# Patient Record
Sex: Male | Born: 1990 | Race: Black or African American | Hispanic: No | Marital: Single | Smoking: Never smoker
Health system: Southern US, Community
[De-identification: ages and names within clinical notes are randomized; demographics above are authoritative.]

## PROBLEM LIST (undated history)

## (undated) ENCOUNTER — Ambulatory Visit: Admission: EM | Discharge status: Against Medical Advice | Payer: Self-pay

## (undated) HISTORY — PX: NO PAST SURGERIES: SHX2092

---

## 2018-05-28 ENCOUNTER — Ambulatory Visit
Admission: EM | Admit: 2018-05-28 | Discharge: 2018-05-28 | Disposition: A | Discharge status: Home / Self Care | Payer: Managed Care, Other (non HMO) | Attending: Family Medicine | Admitting: Family Medicine

## 2018-05-28 ENCOUNTER — Other Ambulatory Visit: Payer: Self-pay

## 2018-05-28 DIAGNOSIS — Z113 Encounter for screening for infections with a predominantly sexual mode of transmission: Secondary | ICD-10-CM | POA: Diagnosis not present

## 2018-05-28 DIAGNOSIS — Z202 Contact with and (suspected) exposure to infections with a predominantly sexual mode of transmission: Secondary | ICD-10-CM

## 2018-05-28 LAB — CHLAMYDIA/NGC RT PCR (ARMC ONLY)
Chlamydia Tr: NOT DETECTED
N gonorrhoeae: NOT DETECTED

## 2018-05-28 NOTE — ED Provider Notes (Signed)
[facility]   CSN: [id number] Arrival date & time: 05/28/18  1448  History   Chief Complaint Chief Complaint  Patient presents with  . SEXUALLY TRANSMITTED DISEASE   HPI  27 year old male presents for STD testing.  Patient has no symptoms.  Denies penile discharge, dysuria.  Patient states that he just wants to be tested.  When I asked him why, he states that he and his partner are not in a relationship.  He states that she is not here for him.  He wants to be tested to ensure he has no STDs.  Denies penile lesions.  No other reported symptoms or concerns at this time.  PMH, Surgical Hx, Family Hx, Social History reviewed and updated as below.  PMH: No significant PMH.  Past Surgical History:  Procedure Laterality Date  . NO PAST SURGERIES     Home Medications    Prior to Admission medications   Not on File   Social History Social History   Tobacco Use  . Smoking status: Never Smoker  . Smokeless tobacco: Never Used  Substance Use Topics  . Alcohol use: Not Currently  . Drug use: Not Currently    Allergies   Patient has no known allergies.   Review of Systems Review of Systems  Constitutional: Negative.   Genitourinary: Negative.    Physical Exam Triage Vital Signs ED Triage Vitals  Enc Vitals Group     BP 05/28/18 1507 117/76     Pulse Rate 05/28/18 1507 73     Resp 05/28/18 1507 18     Temp 05/28/18 1507 98.5 F (36.9 C)     Temp Source 05/28/18 1507 Oral     SpO2 05/28/18 1507 99 %     Weight 05/28/18 1505 220 lb (99.8 kg)     Height 05/28/18 1505 5\' 11"  (1.803 m)     Head Circumference --      Peak Flow --      Pain Score 05/28/18 1505 0     Pain Loc --      Pain Edu? --      Excl. in GC? --    Updated Vital Signs BP 117/76 (BP Location: Right Arm)   Pulse 73   Temp 98.5 F (36.9 C) (Oral)   Resp 18   Ht 5\' 11"  (1.803 m)   Wt 99.8 kg   SpO2 99%   BMI 30.68 kg/m   Visual Acuity Right Eye Distance:   Left Eye  Distance:   Bilateral Distance:    Right Eye Near:   Left Eye Near:    Bilateral Near:     Physical Exam  Constitutional: He is oriented to person, place, and time. He appears well-developed. No distress.  HENT:  Head: Normocephalic and atraumatic.  Eyes: Conjunctivae are normal. Right eye exhibits no discharge. Left eye exhibits no discharge.  Pulmonary/Chest: Effort normal. No respiratory distress.  Neurological: He is alert and oriented to person, place, and time.  Psychiatric: He has a normal mood and affect. His behavior is normal.  Nursing note and vitals reviewed.  UC Treatments / Results  Labs (all labs ordered are listed, but only abnormal results are displayed) Labs Reviewed  CHLAMYDIA/NGC RT PCR ([facility] ONLY)  RPR  HIV ANTIBODY (ROUTINE TESTING W REFLEX)  HSV(HERPES SIMPLEX VRS) I + II AB-IGM  HSV(HERPES SIMPLEX VRS) I + II AB-IGG   EKG None  Radiology No results found.  Procedures Procedures (including critical care time)  Medications  Ordered in UC Medications - No data to display  Initial Impression / Assessment and Plan / UC Course  I have reviewed the triage vital signs and the nursing notes.  Pertinent labs & imaging results that were available during my care of the patient were reviewed by me and considered in my medical decision making (see chart for details).    27 year old male presents with possible exposure to STD.  STD testing completed today.  Awaiting results.  Final Clinical Impressions(s) / UC Diagnoses   Final diagnoses:  Possible exposure to STD     Discharge Instructions     We will call with the results.  Take care  Dr. Adriana Simasook    ED Prescriptions    None     Controlled Substance Prescriptions [place] Controlled Substance Registry consulted? Not Applicable   Tommie SamsCook, [place] Catlin G, DO 05/28/18 1705

## 2018-05-28 NOTE — ED Triage Notes (Signed)
Patient states that he is here to be tested for STDS. Reports that he would like to be tested for Herpes as well, denies any STD symptoms or known exposure.

## 2018-05-28 NOTE — Discharge Instructions (Signed)
We will call with the results.  Take care  Dr. Tahisha Hakim  

## 2018-05-29 LAB — HSV(HERPES SIMPLEX VRS) I + II AB-IGG: HSV 1 GLYCOPROTEIN G AB, IGG: 7 {index} — AB (ref 0.00–0.90)

## 2018-05-29 LAB — HSV(HERPES SIMPLEX VRS) I + II AB-IGM: HSVI/II Comb IgM: <0.91 Ratio (ref 0.00–0.90)

## 2018-05-29 LAB — HIV ANTIBODY (ROUTINE TESTING W REFLEX): HIV SCREEN 4TH GENERATION: NONREACTIVE

## 2018-05-29 LAB — RPR: RPR: NONREACTIVE

## 2018-05-30 ENCOUNTER — Telehealth (HOSPITAL_COMMUNITY): Payer: Self-pay

## 2018-05-30 NOTE — Telephone Encounter (Signed)
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 Attempted to reach patient. No answer at this time.

## 2018-06-01 ENCOUNTER — Ambulatory Visit
Admission: EM | Admit: 2018-06-01 | Discharge: 2018-06-01 | Disposition: A | Discharge status: Home / Self Care | Payer: Managed Care, Other (non HMO) | Attending: Family Medicine | Admitting: Family Medicine

## 2018-06-01 ENCOUNTER — Other Ambulatory Visit: Payer: Self-pay

## 2018-06-01 DIAGNOSIS — Z87898 Personal history of other specified conditions: Secondary | ICD-10-CM | POA: Diagnosis not present

## 2018-06-01 DIAGNOSIS — L02412 Cutaneous abscess of left axilla: Secondary | ICD-10-CM

## 2018-06-01 MED ORDER — SULFAMETHOXAZOLE-TRIMETHOPRIM 800-160 MG PO TABS: 1.0000 | ORAL_TABLET | Freq: Two times a day (BID) | ORAL | 0 refills | Status: DC

## 2018-06-01 NOTE — Discharge Instructions (Signed)
Warm compresses to area every 2-3 hours for 15-20 minutes at a time

## 2018-06-01 NOTE — ED Triage Notes (Signed)
Patient complains of possible abscess to his left axilla that he noticed 2 days ago.   Patient states that he was seen by an urgent care over the weekend due to a reaction from the powder that he works around and was wondering if we could help move to a different location at work.

## 2018-06-01 NOTE — ED Provider Notes (Signed)
[facility]    CSN: [id number] Arrival date & time: 06/01/18  1112     History   Chief Complaint Chief Complaint  Patient presents with  . Abscess    HPI Jarquez Medlen is a 27 y.o. male.   27 yo male with a c/o possible left axilla abscess for 2 days. States he noticed a bump 2 days ago that has gotten bigger and tender. Denies any drainage, fevers, chills.   Patient also states that 2 days ago at work he had a reaction while at work where he felt short of breath and wheezy. States paramedics were called and then he went to another urgent care for evaluation with resolution of his symptoms.  States that in his work he's exposed to different types of flours and he breaths the dust particles from this. Patient requesting to be moved to a different location at work.   The history is provided by the patient.    History reviewed. No pertinent past medical history.  There are no active problems to display for this patient.   Past Surgical History:  Procedure Laterality Date  . NO PAST SURGERIES         Home Medications    Prior to Admission medications   Medication Sig Start Date End Date Taking? Authorizing Provider  sulfamethoxazole-trimethoprim (BACTRIM DS,SEPTRA DS) 800-160 MG tablet Take 1 tablet by mouth 2 (two) times daily. 06/01/18   Payton Mccallum, MD    Family History History reviewed. No pertinent family history.  Social History Social History   Tobacco Use  . Smoking status: Never Smoker  . Smokeless tobacco: Never Used  Substance Use Topics  . Alcohol use: Not Currently  . Drug use: Not Currently     Allergies   Patient has no known allergies.   Review of Systems Review of Systems   Physical Exam Triage Vital Signs ED Triage Vitals  Enc Vitals Group     BP 06/01/18 1157 129/85     Pulse Rate 06/01/18 1157 (!) 54     Resp 06/01/18 1157 18     Temp 06/01/18 1157 98.3 F (36.8 C)     Temp Source 06/01/18 1157 Oral   SpO2 06/01/18 1157 100 %     Weight 06/01/18 1156 220 lb (99.8 kg)     Height 06/01/18 1156 5\' 11"  (1.803 m)     Head Circumference --      Peak Flow --      Pain Score 06/01/18 1156 9     Pain Loc --      Pain Edu? --      Excl. in GC? --    No data found.  Updated Vital Signs BP 129/85 (BP Location: Right Arm)   Pulse (!) 54   Temp 98.3 F (36.8 C) (Oral)   Resp 18   Ht 5\' 11"  (1.803 m)   Wt 99.8 kg   SpO2 100%   BMI 30.68 kg/m   Visual Acuity Right Eye Distance:   Left Eye Distance:   Bilateral Distance:    Right Eye Near:   Left Eye Near:    Bilateral Near:     Physical Exam  Constitutional: He appears well-developed and well-nourished. No distress.  Skin: He is not diaphoretic.  2cm subcutaneous tender nodule, non-fluctuant with overlying skin erythema  Nursing note and vitals reviewed.    UC Treatments / Results  Labs (all labs ordered are listed, but only abnormal results are displayed) Labs  Reviewed - No data to display  EKG None  Radiology No results found.  Procedures Procedures (including critical care time)  Medications Ordered in UC Medications - No data to display  Initial Impression / Assessment and Plan / UC Course  I have reviewed the triage vital signs and the nursing notes.  Pertinent labs & imaging results that were available during my care of the patient were reviewed by me and considered in my medical decision making (see chart for details).      Final Clinical Impressions(s) / UC Diagnoses   Final diagnoses:  Abscess of left axilla  H/O wheezing     Discharge Instructions     Warm compresses to area every 2-3 hours for 15-20 minutes at a time     ED Prescriptions    Medication Sig Dispense Auth. Provider   sulfamethoxazole-trimethoprim (BACTRIM DS,SEPTRA DS) 800-160 MG tablet Take 1 tablet by mouth 2 (two) times daily. 20 tablet Payton Mccallumonty, Ahlivia Salahuddin, MD     1. diagnosis reviewed with patient 2. rx as per orders  above; reviewed possible side effects, interactions, risks and benefits  3. Discussed with patient that he should follow up with PCP or through work to be seen by an occupational health specialist or other specialist for further evaluation of possible allergic reaction at work.   4. Follow-up prn if symptoms worsen or don't improve    Controlled Substance Prescriptions [place] Controlled Substance Registry consulted? Not Applicable   Payton Mccallumonty, Trevonn Hallum, MD 06/01/18 [phone]

## 2018-06-04 ENCOUNTER — Ambulatory Visit (INDEPENDENT_AMBULATORY_CARE_PROVIDER_SITE_OTHER): Payer: Managed Care, Other (non HMO) | Admitting: Allergy

## 2018-06-04 ENCOUNTER — Encounter: Payer: Self-pay | Admitting: Allergy

## 2018-06-04 VITALS — BP 108/84 | HR 68 | Temp 98.0°F | Resp 16 | Ht 71.0 in | Wt 230.0 lb

## 2018-06-04 DIAGNOSIS — R062 Wheezing: Secondary | ICD-10-CM

## 2018-06-04 DIAGNOSIS — T7840XD Allergy, unspecified, subsequent encounter: Secondary | ICD-10-CM | POA: Diagnosis not present

## 2018-06-04 DIAGNOSIS — J3089 Other allergic rhinitis: Secondary | ICD-10-CM | POA: Diagnosis not present

## 2018-06-04 MED ORDER — ALBUTEROL SULFATE HFA 108 (90 BASE) MCG/ACT IN AERS: INHALATION_SPRAY | RESPIRATORY_TRACT | 1 refills | Status: AC

## 2018-06-04 NOTE — Progress Notes (Signed)
New Patient Note  RE: George Parks MRN: [id number] DOB: Oct 11, 1990 Date of Office Visit: 06/04/2018  Referring provider: No ref. provider found Primary care provider: Patient, No Pcp Per  Chief Complaint: Food Intolerance  History of Present Illness: I had the pleasure of seeing George Parks for initial evaluation at the [facility] on 06/05/2018. He is a 27 y.o. male, who is self-referred here for the evaluation of allergies.    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 He reports symptoms of chest tightness, wheezing, itchy lips, sneezing, rhinorrhea, and watery eyes. The symptoms usually start within 1 hour of working and resolve a few hours after he is off shift. Symptoms have been going on for 2-3 weeks and progressively worsening. He has bee moved to a different area where there is no flour in the air and has not had any issues. Patient is able to consume gluten items with no issues.   Patient does not take any medications for this. No previous history of asthma. He does have some pollen allergies which bother his eyes during the spring time.  Assessment and Plan: George Parks is a 27 y.o. 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 Other symptoms include itchy lips, sneezing, rhinorrhea, and watery eyes. Usually starts within 1 hour of starting shift and resolves a few hours after ending his shift. No history of asthma in the past.  Today's spirometry was normal and no improvement post-bronchodilator treatment.  Today's skin testing showed: positive to grass, weed, ragweed, trees, cat and slightly to mold. Food testing was borderline positive to barley, hops and rye however patient has no issues with consuming gluten containing foods.   He most likely has some type of contact sensitivity when inhaling or  coming contact with the dry uncooked flour particles. Advised patient to avoid working in that part of the building. He has been having no issues with working near the [employer] areas.   For mild symptoms you can take over the counter antihistamines such as Benadryl and monitor symptoms closely. If symptoms worsen or if you have severe symptoms including breathing issues, throat closure, significant swelling, whole body hives, severe diarrhea and vomiting, lightheadedness then seek immediate medical care.  Provided patient with letter to present to his employer regarding today's evaluation.   Wheezing See plan as above.  May use albuterol rescue inhaler 2 puffs or nebulizer every 4 to 6 hours as needed for shortness of breath, chest tightness, coughing, and wheezing. May use albuterol rescue inhaler 2 puffs 5 to 15 minutes prior to strenuous physical activities.  Other allergic rhinitis  Today's skin testing showed: positive to grass, weed, ragweed, trees, cat and slightly to mold.  Discussed environmental control measures.  May use over the counter antihistamines such as Zyrtec (cetirizine), Claritin (loratadine), Allegra (fexofenadine), or Xyzal (levocetirizine) daily as needed.  Return in about 4 months (around 10/03/2018).  Other allergy screening: Asthma: no Rhino conjunctivitis: yes Patient gets itchy eyes during the spring only. Food allergy: no  Dietary History: patient has been eating other foods including milk, eggs, peanut, treenuts, sesame, shellfish, seafood, soy, wheat, meats, fruits and vegetables.  Medication allergy: no Hymenoptera allergy: no Urticaria: no Eczema:no History of recurrent infections suggestive of immunodeficency: no  Diagnostics: Spirometry:  Tracings reviewed. His effort: Good reproducible efforts. FVC:  5.12L FEV1: 3.86L, 92% predicted FEV1/FVC ratio: 75% Interpretation: Spirometry consistent with normal pattern and no improvement post  bronchodilator treatment. Please see scanned spirometry results for details.  Skin Testing: Environmental allergy panel and select foods Positive test to: grass, weed, ragweed, trees, cat and slightly to mold. Food testing was borderline positive to barley, hops and rye. He also had slight positive reaction to the negative control test.   Results discussed with patient/family. Airborne Adult Perc - 06/04/18 1516    Time Antigen Placed  1516    Allergen Manufacturer  Waynette Buttery    Location  Back    Number of Test  59    Panel 1  Select    1. Control-Buffer 50% Glycerol  Negative    2. Control-Histamine 1 mg/ml  3+    3. Albumin saline  2+    4. Bahia  Negative    5. [place]  4+    6. Johnson  4+    7. Kentucky Blue  Negative    8. Meadow Fescue  4+    9. Perennial Rye  4+    10. Sweet Vernal  4+    11. Timothy  4+    12. Cocklebur  4+    13. Burweed Marshelder  3+    14. Ragweed, short  4+    15. Ragweed, Giant  2+    16. Plantain,  English  4+    17. Lamb's Quarters  4+    18. Sheep Sorrell  4+    19. Rough Pigweed  Negative    20. Marsh Elder, Rough  4+    21. Mugwort, Common  4+    22. Ash mix  4+    23. Birch mix  4+    24. Beech American  4+    25. Box, Elder  4+    26. Cedar, red  4+    27. Cottonwood, Eastern  4+    28. Elm mix  4+    29. Hickory mix  4+    30. Maple mix  Negative    31. Oak, Guinea-Bissau mix  4+    32. Pecan Pollen  4+    33. Pine mix  4+    34. Sycamore Eastern  4+    35. Walnut, Black Pollen  4+    36. Alternaria alternata  Negative    37. Cladosporium Herbarum  Negative    38. Aspergillus mix  Negative    39. Penicillium mix  Negative    40. Bipolaris sorokiniana (Helminthosporium)  Negative    41. Drechslera spicifera (Curvularia)  Negative    42. Mucor plumbeus  Negative    43. Fusarium moniliforme  Negative    44. Aureobasidium pullulans (pullulara)  Negative    45. Rhizopus oryzae  Negative    46. Botrytis cinera  Negative    47.  Epicoccum nigrum  Negative    48. Phoma betae  Negative    49. Candida Albicans  Negative    50. Trichophyton mentagrophytes  Negative    51. Mite, D Farinae  5,000 AU/ml  Negative    52. Mite, D Pteronyssinus  5,000 AU/ml  Negative    53. Cat Hair 10,000 BAU/ml  Negative    54.  Dog Epithelia  Negative    55. Mixed Feathers  Negative    56. Horse Epithelia  Negative    57. Cockroach, German  Negative    58. Mouse  Negative  59. Tobacco Leaf  Negative     Intradermal - 06/04/18 1538    Time Antigen Placed  1538    Allergen Manufacturer  Waynette Buttery    Location  Arm    Number of Test  8    Intradermal  Select    Control  Negative    Mold 1  2+    Mold 2  Negative    Mold 3  2+    Mold 4  2+    Cat  2+    Dog  Negative    Cockroach  Negative    Mite mix  Negative     Food Adult Perc - 06/04/18 1500    Time Antigen Placed  1516    Location  Back    Number of allergen test  6    Control-Histamine 1 mg/ml  3+    3. Wheat  Negative    30. Barley  2+   4x4   31. Oat   Negative    32. Rye   2+   2x2   33. Hops  2+   2x2   36. Saccharomyces Cerevisiae   Negative       Past Medical History: Patient Active Problem List   Diagnosis Date Noted  . Allergic reaction 06/05/2018  . Wheezing 06/04/2018  . Other allergic rhinitis 06/04/2018   History reviewed. No pertinent past medical history. Past Surgical History: Past Surgical History:  Procedure Laterality Date  . NO PAST SURGERIES     Medication List:  Current Outpatient Medications  Medication Sig Dispense Refill  . sulfamethoxazole-trimethoprim (BACTRIM DS,SEPTRA DS) 800-160 MG tablet Take 1 tablet by mouth 2 (two) times daily. 20 tablet 0  . albuterol (PROVENTIL HFA;VENTOLIN HFA) 108 (90 Base) MCG/ACT inhaler Use 2 puffs every 4 to 6 hours as needed for shortness of breath, chest tightness, coughing and wheezing 1 Inhaler 1   No current facility-administered medications for this visit.    Allergies: Allergies    Allergen Reactions  . Pollen Extract Other (See Comments)    Unknown reaction   Social History: Social History   Socioeconomic History  . Marital status: Single    Spouse name: Not on file  . Number of children: Not on file  . Years of education: Not on file  . Highest education level: Not on file  Occupational History  . Not on file  Social Needs  . Financial resource strain: Not on file  . Food insecurity:    Worry: Not on file    Inability: Not on file  . Transportation needs:    Medical: Not on file    Non-medical: Not on file  Tobacco Use  . Smoking status: Never Smoker  . Smokeless tobacco: Never Used  Substance and Sexual Activity  . Alcohol use: Not Currently  . Drug use: Not Currently  . Sexual activity: Not on file  Lifestyle  . Physical activity:    Days per week: Not on file    Minutes per session: Not on file  . Stress: Not on file  Relationships  . Social connections:    Talks on phone: Not on file    Gets together: Not on file    Attends religious service: Not on file    Active member of club or organization: Not on file    Attends meetings of clubs or organizations: Not on file    Relationship status: Not on file  Other Topics Concern  . Not on  file  Social History Narrative  . Not on file   Lives in a apartment. Smoking: denies Occupation: flour mixer for 3.5 weeks  Environmental History: Water Damage/mildew in the house: no Carpet in the family room: no Carpet in the bedroom: no Heating: gas Cooling: central Pet: no  Family History: History reviewed. No pertinent family history. Problem                               Relation Asthma                                   No  Eczema                                No  Food allergy                          No  Allergic rhino conjunctivitis     Father   Review of Systems  Constitutional: Negative for appetite change, chills, fever and unexpected weight change.  HENT: Negative for  congestion and rhinorrhea.   Eyes: Negative for itching.  Respiratory: Negative for cough, chest tightness, shortness of breath and wheezing.   Cardiovascular: Negative for chest pain.  Gastrointestinal: Negative for abdominal pain.  Genitourinary: Negative for difficulty urinating.  Skin: Positive for rash.  Allergic/Immunologic: Negative for environmental allergies and food allergies.  Neurological: Negative for headaches.   Objective: BP 108/84 (BP Location: Right Arm, Patient Position: Sitting, Cuff Size: Large)   Pulse 68   Temp 98 F (36.7 C) (Oral)   Resp 16   Ht 5\' 11"  (1.803 m)   Wt 230 lb (104.3 kg)   SpO2 97%   BMI 32.08 kg/m  Body mass index is 32.08 kg/m. Physical Exam  Constitutional: He is oriented to person, place, and time. He appears well-developed and well-nourished.  HENT:  Head: Normocephalic and atraumatic.  Right Ear: External ear normal.  Left Ear: External ear normal.  Nose: Nose normal.  Mouth/Throat: Oropharynx is clear and moist.  Eyes: Conjunctivae and EOM are normal.  Neck: Neck supple.  Cardiovascular: Normal rate, regular rhythm and normal heart sounds. Exam reveals no gallop and no friction rub.  No murmur heard. Pulmonary/Chest: Effort normal and breath sounds normal. He has no wheezes. He has no rales.  Abdominal: Soft. Bowel sounds are normal. There is no tenderness.  Lymphadenopathy:    He has no cervical adenopathy.  Neurological: He is alert and oriented to person, place, and time.  Skin: Skin is warm. No rash noted.  Psychiatric: He has a normal mood and affect. His behavior is normal.  Nursing note and vitals reviewed.  The plan was reviewed with the patient/family, and all questions/concerned were addressed.  It was my pleasure to see George Parks today and participate in his care. Please feel free to contact me with any questions or concerns.  Sincerely,  Wyline MoodYoon Kim, DO Allergy & Immunology  [facility]  [place] office: [phone] [facility] office:[phone]

## 2018-06-04 NOTE — Patient Instructions (Signed)
Today's skin testing was positive to grass, weed, ragweed, trees, cat and slightly to mold.  Food testing was borderline positive to barley, hops and rye.  May use albuterol rescue inhaler 2 puffs or nebulizer every 4 to 6 hours as needed for shortness of breath, chest tightness, coughing, and wheezing. May use albuterol rescue inhaler 2 puffs 5 to 15 minutes prior to strenuous physical activities.  For mild symptoms you can take over the counter antihistamines such as Benadryl and monitor symptoms closely. If symptoms worsen or if you have severe symptoms including breathing issues, throat closure, significant swelling, whole body hives, severe diarrhea and vomiting, lightheadedness then seek immediate medical care.  May use over the counter antihistamines such as Zyrtec (cetirizine), Claritin (loratadine), Allegra (fexofenadine), or Xyzal (levocetirizine) daily as needed.  Follow up in 4 months. Will let you know when letter is ready for you to pick up.   Reducing Pollen Exposure . Pollen seasons: trees (spring), grass (summer) and ragweed/weeds (fall). Marland Kitchen. Keep windows closed in your home and car to lower pollen exposure.  Lilian Kapur. Install air conditioning in the bedroom and throughout the house if possible.  . Avoid going out in dry windy days - especially early morning. . Pollen counts are highest between 5 - 10 AM and on dry, hot and windy days.  . Save outside activities for late afternoon or after a heavy rain, when pollen levels are lower.  . Avoid mowing of grass if you have grass pollen allergy. Marland Kitchen. Be aware that pollen can also be transported indoors on people and pets.  . Dry your clothes in an automatic dryer rather than hanging them outside where they might collect pollen.  . Rinse hair and eyes before bedtime. Pet Allergen Avoidance: . Contrary to popular opinion, there are no "hypoallergenic" breeds of dogs or cats. That is because people are not allergic to an animal's hair, but to an  allergen found in the animal's saliva, dander (dead skin flakes) or urine. Pet allergy symptoms typically occur within minutes. For some people, symptoms can build up and become most severe 8 to 12 hours after contact with the animal. People with severe allergies can experience reactions in public places if dander has been transported on the pet owners' clothing. Marland Kitchen. Keeping an animal outdoors is only a partial solution, since homes with pets in the yard still have higher concentrations of animal allergens. . Before getting a pet, ask your allergist to determine if you are allergic to animals. If your pet is already considered part of your family, try to minimize contact and keep the pet out of the bedroom and other rooms where you spend a great deal of time. . As with dust mites, vacuum carpets often or replace carpet with a hardwood floor, tile or linoleum. . High-efficiency particulate air (HEPA) cleaners can reduce allergen levels over time. . While dander and saliva are the source of cat and dog allergens, urine is the source of allergens from rabbits, hamsters, mice and [place]; so ask a non-allergic family member to clean the animal's cage. . If you have a pet allergy, talk to your allergist about the potential for allergy immunotherapy (allergy shots). This strategy can often provide long-term relief. Mold Control . Mold and fungi can grow on a variety of surfaces provided certain temperature and moisture conditions exist.  . Outdoor molds grow on plants, decaying vegetation and soil. The major outdoor mold, Alternaria and Cladosporium, are found in very high numbers during  hot and dry conditions. Generally, a late summer - fall peak is seen for common outdoor fungal spores. Rain will temporarily lower outdoor mold spore count, but counts rise rapidly when the rainy period ends. . The most important indoor molds are Aspergillus and Penicillium. Dark, humid and poorly ventilated basements are  ideal sites for mold growth. The next most common sites of mold growth are the bathroom and the kitchen. Outdoor (Seasonal) Mold Control . Use air conditioning and keep windows closed. . Avoid exposure to decaying vegetation. Marland Kitchen Avoid leaf raking. . Avoid grain handling. . Consider wearing a face mask if working in moldy areas.  Indoor (Perennial) Mold Control  . Maintain humidity below 50%. . Get rid of mold growth on hard surfaces with water, detergent and, if necessary, 5% bleach (do not mix with other cleaners). Then dry the area completely. If mold covers an area more than 10 square feet, consider hiring an indoor environmental professional. . For clothing, washing with soap and water is best. If moldy items cannot be cleaned and dried, throw them away. . Remove sources e.g. contaminated carpets. . Repair and seal leaking roofs or pipes. Using dehumidifiers in damp basements may be helpful, but empty the water and clean units regularly to prevent mildew from forming. All rooms, especially basements, bathrooms and kitchens, require ventilation and cleaning to deter mold and mildew growth. Avoid carpeting on concrete or damp floors, and storing items in damp areas.

## 2018-06-04 NOTE — Assessment & Plan Note (Deleted)
   Today's skin testing showed: positive to grass, weed, ragweed, trees, cat and slightly to mold.  Discussed environmental control measures.  May use over the counter antihistamines such as Zyrtec (cetirizine), Claritin (loratadine), Allegra (fexofenadine), or Xyzal (levocetirizine) daily as needed. 

## 2018-06-04 NOTE — Assessment & Plan Note (Deleted)
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 Other symptoms include itchy lips, sneezing, rhinorrhea, and watery eyes. Usually starts within 1 hour of starting shift and resolves a few hours after ending his shift. No history of asthma in the past.  Today's spirometry was normal and no improvement post-bronchodilator treatment.  Today's skin testing showed: positive to grass, weed, ragweed, trees, cat and slightly to mold. Food testing was borderline positive to barley, hops and rye however patient has no issues with consuming gluten containing foods.   He most likely has some type of irritation when inhaling the dry uncooked flour particles. Advised patient to avoid working in that part of the building. He has been having no issues with working near the [employer] areas.   May use albuterol rescue inhaler 2 puffs or nebulizer every 4 to 6 hours as needed for shortness of breath, chest tightness, coughing, and wheezing. May use albuterol rescue inhaler 2 puffs 5 to 15 minutes prior to strenuous physical activities.

## 2018-06-05 ENCOUNTER — Encounter: Payer: Self-pay | Admitting: Allergy

## 2018-06-05 ENCOUNTER — Telehealth: Payer: Self-pay | Admitting: *Deleted

## 2018-06-05 ENCOUNTER — Encounter: Payer: Self-pay | Admitting: *Deleted

## 2018-06-05 DIAGNOSIS — T7840XA Allergy, unspecified, initial encounter: Secondary | ICD-10-CM | POA: Insufficient documentation

## 2018-06-05 NOTE — Assessment & Plan Note (Signed)
See plan as above.  May use albuterol rescue inhaler 2 puffs or nebulizer every 4 to 6 hours as needed for shortness of breath, chest tightness, coughing, and wheezing. May use albuterol rescue inhaler 2 puffs 5 to 15 minutes prior to strenuous physical activities.

## 2018-06-05 NOTE — Telephone Encounter (Signed)
[facility]. Patient picked up letter.

## 2018-06-05 NOTE — Progress Notes (Signed)
    RE: George Parks Noyola MRN: [id number] DOB: September 17, 1990 Date of Office Visit: 06/05/2018  Referring provider: No ref. provider found Primary care provider: Patient, No Pcp Per  To Whom It May Concern:  I had the pleasure of seeing George Parks Dols for initial evaluation at the [facility] on 06/05/2018. He is a 27 y.o. male, who was referred here by his employer to evaluate for allergies.   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 Other symptoms include itchy lips, sneezing, rhinorrhea, and watery eyes. These symptoms usually start within 1 hour of starting his shift and resolves a few hours after ending his shift. Since he has moved locations to be working near [employer], he denies having any symptoms. No history of asthma.   Today's spirometry was normal and no improvement post-bronchodilator treatment. Today's skin testing showed: positive to grass, weed, ragweed, trees, cat and slightly to mold. Food testing was borderline positive to barley, hops and rye however patient has no issues with consuming gluten containing foods.   He most likely has some type of contact sensitivity when inhaling or coming contact with large amounts of dry uncooked flour particles. Recommend that patient does not work where there are significant amounts of flour particles in the air visible. He may work at other parts of the building without any restrictions.   Please feel free to contact me with any questions or concerns.  Sincerely,    Wyline MoodYoon Kim, DO Allergy & Immunology  [facility] [place] [place] office: [phone] [facility] office:[phone]

## 2018-06-05 NOTE — Assessment & Plan Note (Signed)
   Today's skin testing showed: positive to grass, weed, ragweed, trees, cat and slightly to mold.  Discussed environmental control measures.  May use over the counter antihistamines such as Zyrtec (cetirizine), Claritin (loratadine), Allegra (fexofenadine), or Xyzal (levocetirizine) daily as needed.

## 2018-06-05 NOTE — Assessment & Plan Note (Addendum)
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 Other symptoms include itchy lips, sneezing, rhinorrhea, and watery eyes. Usually starts within 1 hour of starting shift and resolves a few hours after ending his shift. No history of asthma in the past.  Today's spirometry was normal and no improvement post-bronchodilator treatment.  Today's skin testing showed: positive to grass, weed, ragweed, trees, cat and slightly to mold. Food testing was borderline positive to barley, hops and rye however patient has no issues with consuming gluten containing foods.   He most likely has some type of contact sensitivity when inhaling or coming contact with the dry uncooked flour particles. Advised patient to avoid working in that part of the building. He has been having no issues with working near the [employer] areas.   For mild symptoms you can take over the counter antihistamines such as Benadryl and monitor symptoms closely. If symptoms worsen or if you have severe symptoms including breathing issues, throat closure, significant swelling, whole body hives, severe diarrhea and vomiting, lightheadedness then seek immediate medical care.  Provided patient with letter to present to his employer regarding today's evaluation.

## 2018-06-05 NOTE — Telephone Encounter (Signed)
-----   Message from Ellamae SiaYoon M Kim, DO sent at 06/05/2018  9:07 AM EST ----- Regarding: letter Please call patient and tell him that letter is ready to be picked up. Please print this letter and give to patient so he can give it to his employer.  Thank you.

## 2018-06-05 NOTE — Telephone Encounter (Signed)
Left message for patient to call the office

## 2018-06-21 ENCOUNTER — Ambulatory Visit
Admission: EM | Admit: 2018-06-21 | Discharge: 2018-06-21 | Disposition: A | Discharge status: Home / Self Care | Payer: Managed Care, Other (non HMO) | Attending: Family Medicine | Admitting: Family Medicine

## 2018-06-21 ENCOUNTER — Other Ambulatory Visit: Payer: Self-pay

## 2018-06-21 DIAGNOSIS — L02412 Cutaneous abscess of left axilla: Secondary | ICD-10-CM | POA: Diagnosis not present

## 2018-06-21 MED ORDER — SULFAMETHOXAZOLE-TRIMETHOPRIM 800-160 MG PO TABS: 1.0000 | ORAL_TABLET | Freq: Two times a day (BID) | ORAL | 0 refills | Status: DC

## 2018-06-21 NOTE — ED Provider Notes (Signed)
[facility]    CSN: [id number] Arrival date & time: 06/21/18  1556     History   Chief Complaint Chief Complaint  Patient presents with  . Abscess    HPI Cayton Dannielle HuhFairley is a 27 y.o. male.   27 yo male with a c/o abscess under left axilla. Patient was seen here about 3 weeks ago and started on oral antibiotic. States it's improved, however has not resolved completely. Denies fevers or chills.   The history is provided by the patient.  Abscess    History reviewed. No pertinent past medical history.  Patient Active Problem List   Diagnosis Date Noted  . Allergic reaction 06/05/2018  . Wheezing 06/04/2018  . Other allergic rhinitis 06/04/2018    Past Surgical History:  Procedure Laterality Date  . NO PAST SURGERIES         Home Medications    Prior to Admission medications   Medication Sig Start Date End Date Taking? Authorizing Provider  albuterol (PROVENTIL HFA;VENTOLIN HFA) 108 (90 Base) MCG/ACT inhaler Use 2 puffs every 4 to 6 hours as needed for shortness of breath, chest tightness, coughing and wheezing 06/04/18  Yes Ellamae SiaKim, Yoon M, DO  sulfamethoxazole-trimethoprim (BACTRIM DS,SEPTRA DS) 800-160 MG tablet Take 1 tablet by mouth 2 (two) times daily. 06/21/18   Payton Mccallumonty, Keriana Sarsfield, MD    Family History History reviewed. No pertinent family history.  Social History Social History   Tobacco Use  . Smoking status: Never Smoker  . Smokeless tobacco: Never Used  Substance Use Topics  . Alcohol use: Not Currently  . Drug use: Not Currently     Allergies   Pollen extract   Review of Systems Review of Systems   Physical Exam Triage Vital Signs ED Triage Vitals  Enc Vitals Group     BP 06/21/18 1601 119/72     Pulse Rate 06/21/18 1601 68     Resp 06/21/18 1601 18     Temp 06/21/18 1601 97.8 F (36.6 C)     Temp Source 06/21/18 1601 Oral     SpO2 06/21/18 1601 98 %     Weight 06/21/18 1600 230 lb (104.3 kg)     Height --      Head  Circumference --      Peak Flow --      Pain Score 06/21/18 1600 2     Pain Loc --      Pain Edu? --      Excl. in GC? --    No data found.  Updated Vital Signs BP 119/72 (BP Location: Left Arm)   Pulse 68   Temp 97.8 F (36.6 C) (Oral)   Resp 18   Wt 104.3 kg   SpO2 98%   BMI 32.08 kg/m   Visual Acuity Right Eye Distance:   Left Eye Distance:   Bilateral Distance:    Right Eye Near:   Left Eye Near:    Bilateral Near:     Physical Exam  Constitutional: He appears well-developed and well-nourished. No distress.  Skin: He is not diaphoretic.  1cm slightly tender subcutaneous nodule; no drainage; decreased/improved since 1 month ago  Nursing note and vitals reviewed.    UC Treatments / Results  Labs (all labs ordered are listed, but only abnormal results are displayed) Labs Reviewed - No data to display  EKG None  Radiology No results found.  Procedures Procedures (including critical care time)  Medications Ordered in UC Medications - No data to  display  Initial Impression / Assessment and Plan / UC Course  I have reviewed the triage vital signs and the nursing notes.  Pertinent labs & imaging results that were available during my care of the patient were reviewed by me and considered in my medical decision making (see chart for details).      Final Clinical Impressions(s) / UC Diagnoses   Final diagnoses:  Abscess of left axilla     Discharge Instructions     Warm compresses to area    ED Prescriptions    Medication Sig Dispense Auth. Provider   sulfamethoxazole-trimethoprim (BACTRIM DS,SEPTRA DS) 800-160 MG tablet Take 1 tablet by mouth 2 (two) times daily. 20 tablet Payton Mccallum, MD     1. diagnosis reviewed with patient 2. rx as per orders above; reviewed possible side effects, interactions, risks and benefits  3. Recommend supportive treatment as above 4. Follow-up prn if symptoms worsen or don't improve  Controlled Substance  Prescriptions [place] Controlled Substance Registry consulted? Not Applicable   Payton Mccallum, MD 06/21/18 [phone]

## 2018-06-21 NOTE — Discharge Instructions (Signed)
Warm compresses to area °

## 2018-06-21 NOTE — ED Triage Notes (Signed)
Patient complains of abscess that started on 05/30/2018. Patient states that he was seen here on 06/01/2018 and given bactrim, reports that it has improved some but has not fully resolved.

## 2018-06-22 ENCOUNTER — Telehealth: Payer: Self-pay | Admitting: *Deleted

## 2018-06-22 NOTE — Telephone Encounter (Signed)
Spoke to patient advised albuterol inhaler was sent in and he needs to pick this up. Patient was unsure of this rx. Patient will pick up rx.

## 2018-06-22 NOTE — Telephone Encounter (Signed)
Please call patient back. We sent in albuterol inhaler at the last visit. Did he not pick it up?

## 2018-06-22 NOTE — Telephone Encounter (Signed)
Patient called to check and see if Dr Selena BattenKim is calling him in an inhaler, patient would like to have something to help him a little bit at work. Please advise [phone]   [facility] [address]

## 2018-06-22 NOTE — Telephone Encounter (Signed)
Dr. Kim please advise 

## 2018-07-20 ENCOUNTER — Encounter: Payer: Self-pay | Admitting: Emergency Medicine

## 2018-07-20 ENCOUNTER — Other Ambulatory Visit: Payer: Self-pay

## 2018-07-20 ENCOUNTER — Ambulatory Visit
Admission: EM | Admit: 2018-07-20 | Discharge: 2018-07-20 | Disposition: A | Discharge status: Home / Self Care | Payer: Managed Care, Other (non HMO) | Attending: Family Medicine | Admitting: Family Medicine

## 2018-07-20 DIAGNOSIS — M79622 Pain in left upper arm: Secondary | ICD-10-CM

## 2018-07-20 NOTE — Discharge Instructions (Signed)
Rest.  No evidence of adenopathy or abscess.  Take care  Dr. Adriana Simas

## 2018-07-20 NOTE — ED Provider Notes (Signed)
[facility]    CSN: [id number] Arrival date & time: 07/20/18  1605  History   Chief Complaint Chief Complaint  Patient presents with  . Adenopathy    HPI  28 year old male presents with concerns for abscess or adenopathy.  Patient has been seen here twice recently.  Once in November and once in December for abscess.  Patient states that he still has some pain in the left axilla.  Patient has a palpable area that he can feel.  No drainage.  No reports of redness.  No fever.  Patient is unsure of what is the cause at this point time.  He is concerned about recurrence of abscess and the need for antibiotics.  No other associated symptoms.  No other complaints or concerns at this time.  History reviewed and updated as below. PMH: Patient Active Problem List   Diagnosis Date Noted  . Allergic reaction 06/05/2018  . Wheezing 06/04/2018  . Other allergic rhinitis 06/04/2018    Past Surgical History:  Procedure Laterality Date  . NO PAST SURGERIES     Home Medications    Prior to Admission medications   Medication Sig Start Date End Date Taking? Authorizing Provider  albuterol (PROVENTIL HFA;VENTOLIN HFA) 108 (90 Base) MCG/ACT inhaler Use 2 puffs every 4 to 6 hours as needed for shortness of breath, chest tightness, coughing and wheezing 06/04/18   Ellamae SiaKim, Yoon M, DO  sulfamethoxazole-trimethoprim (BACTRIM DS,SEPTRA DS) 800-160 MG tablet Take 1 tablet by mouth 2 (two) times daily. 06/21/18   Payton Mccallumonty, [place], MD    Social History Social History   Tobacco Use  . Smoking status: Never Smoker  . Smokeless tobacco: Never Used  Substance Use Topics  . Alcohol use: Not Currently  . Drug use: Not Currently     Allergies   Pollen extract   Review of Systems Review of Systems  Constitutional: Negative.   Skin:       Concern for abscess, axilla.   Physical Exam Triage Vital Signs ED Triage Vitals  Enc Vitals Group     BP 07/20/18 1615 114/74     Pulse Rate  07/20/18 1615 78     Resp 07/20/18 1615 18     Temp 07/20/18 1615 98.5 F (36.9 C)     Temp Source 07/20/18 1615 Oral     SpO2 07/20/18 1615 97 %     Weight 07/20/18 1613 220 lb (99.8 kg)     Height 07/20/18 1613 5\' 11"  (1.803 m)     Head Circumference --      Peak Flow --      Pain Score 07/20/18 1613 0     Pain Loc --      Pain Edu? --      Excl. in GC? --    Updated Vital Signs BP 114/74 (BP Location: Right Arm)   Pulse 78   Temp 98.5 F (36.9 C) (Oral)   Resp 18   Ht 5\' 11"  (1.803 m)   Wt 99.8 kg   SpO2 97%   BMI 30.68 kg/m   Visual Acuity Right Eye Distance:   Left Eye Distance:   Bilateral Distance:    Right Eye Near:   Left Eye Near:    Bilateral Near:     Physical Exam Vitals signs and nursing note reviewed.  Constitutional:      General: He is not in acute distress. HENT:     Head: Normocephalic and atraumatic.  Eyes:  General: No scleral icterus.    Conjunctiva/sclera: Conjunctivae normal.  Cardiovascular:     Rate and Rhythm: Normal rate and regular rhythm.  Pulmonary:     Effort: Pulmonary effort is normal. No respiratory distress.  Skin:    Comments: Left axilla -no appreciable mass, lymphadenopathy.  Neurological:     Mental Status: He is alert.  Psychiatric:        Mood and Affect: Mood normal.        Behavior: Behavior normal.    UC Treatments / Results  Labs (all labs ordered are listed, but only abnormal results are displayed) Labs Reviewed - No data to display  EKG None  Radiology No results found.  Procedures Procedures (including critical care time)  Medications Ordered in UC Medications - No data to display  Initial Impression / Assessment and Plan / UC Course  I have reviewed the triage vital signs and the nursing notes.  Pertinent labs & imaging results that were available during my care of the patient were reviewed by me and considered in my medical decision making (see chart for details).    28 year old male  presents with.  Complaints of pain in the axilla.  No appreciable mass or adenopathy.  Advised supportive care and rest.  Final Clinical Impressions(s) / UC Diagnoses   Final diagnoses:  Pain in left axilla     Discharge Instructions     Rest.  No evidence of adenopathy or abscess.  Take care  Dr. Adriana Simasook    ED Prescriptions    None     Controlled Substance Prescriptions [place] Controlled Substance Registry consulted? Not Applicable   Tommie SamsCook, Evangelyn Crouse G, DO 07/20/18 1826

## 2018-07-20 NOTE — ED Triage Notes (Signed)
Patient c/o swollen area under his left arm x 2 weeks.

## 2018-07-23 ENCOUNTER — Encounter: Payer: Self-pay | Admitting: Allergy

## 2018-07-23 ENCOUNTER — Ambulatory Visit (INDEPENDENT_AMBULATORY_CARE_PROVIDER_SITE_OTHER): Payer: Managed Care, Other (non HMO) | Admitting: Allergy

## 2018-07-23 VITALS — BP 108/70 | HR 76 | Resp 16

## 2018-07-23 DIAGNOSIS — T7840XD Allergy, unspecified, subsequent encounter: Secondary | ICD-10-CM | POA: Diagnosis not present

## 2018-07-23 DIAGNOSIS — J3089 Other allergic rhinitis: Secondary | ICD-10-CM

## 2018-07-23 DIAGNOSIS — R062 Wheezing: Secondary | ICD-10-CM

## 2018-07-23 NOTE — Patient Instructions (Addendum)
Allergic reaction  Form filled out for work.  Wheezing  May use albuterol rescue inhaler 2 puffs or nebulizer every 4 to 6 hours as needed for shortness of breath, chest tightness, coughing, and wheezing. May use albuterol rescue inhaler 2 puffs 5 to 15 minutes prior to strenuous physical activities.  Other allergic rhinitis  2019 skin testing showed: positive to grass, weed, ragweed, trees, cat and slightly to mold.  Continue environmental control measures.  May use over the counter antihistamines such as Zyrtec (cetirizine), Claritin (loratadine), Allegra (fexofenadine), or Xyzal (levocetirizine) daily as needed.  Follow up as needed.

## 2018-07-23 NOTE — Assessment & Plan Note (Signed)
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 Other symptoms include itchy lips, sneezing, rhinorrhea, and watery eyes. Usually starts within 1 hour of starting shift and resolves a few hours after ending his shift. No history of asthma in the past. 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  Filled out new forms and scanned into chart.   He most likely has some type of contact sensitivity when inhaling or coming contact with the dry uncooked flour particles. Advised patient to avoid working in that part of the building. He has been having no issues with working near the [employer] areas.   For mild symptoms you can take over the counter antihistamines such as Benadryl and monitor symptoms closely. If symptoms worsen or if you have severe symptoms including breathing issues, throat closure, significant swelling, whole body hives, severe diarrhea and vomiting, lightheadedness then seek immediate medical care.

## 2018-07-23 NOTE — Progress Notes (Signed)
Follow Up Note  RE: George Parks Ceballos MRN: [id number] DOB: [birth year] Date of Office Visit: 07/23/2018  Referring provider: No ref. provider found Primary care provider: Patient, No Pcp Per  Chief Complaint: Allergic Reaction (needs for work )  History of Present Illness: I had the pleasure of seeing George Parks Loss for a follow up visit at the [facility] [employer] [facility] on 07/23/2018. He is a 28 y.o. male, who is being followed for allergic rhinitis, wheezing and allergic reactions. Today he is here for new complaint of needing paperwork filled out for his employer. His previous allergy office visit was on 06/04/2018 with Dr. Selena BattenKim.   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 He has had no issues since not around the dry flour. However, the company is requiring some paperwork to be filled out regarding the request of not being able to work around the dry flour area.   He did not have to use inhalers at all since the last visit.   Other allergic rhinitis  Well-controlled with no medications.   Assessment and Plan: George Parks is a 28 y.o. 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 Other symptoms include itchy lips, sneezing, rhinorrhea, and watery eyes. Usually starts within 1 hour of starting shift and resolves a few hours after ending his shift. No history of asthma in the past. 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  Filled out new forms and scanned into chart.   He most likely has some type of contact sensitivity when inhaling or coming contact with the dry uncooked flour particles. Advised patient to avoid working in that part of the building. He has been having no issues with working near the [employer] areas.     For mild symptoms you can take over the counter antihistamines such as Benadryl and monitor symptoms closely. If symptoms worsen or if you have severe symptoms including breathing issues, throat closure, significant swelling, whole body hives, severe diarrhea and vomiting, lightheadedness then seek immediate medical care.  Wheezing See plan as above.  Today's spirometry was normal and no inhaler use since the last visit.   May use albuterol rescue inhaler 2 puffs or nebulizer every 4 to 6 hours as needed for shortness of breath, chest tightness, coughing, and wheezing. May use albuterol rescue inhaler 2 puffs 5 to 15 minutes prior to strenuous physical activities.  Other allergic rhinitis Past history - 2019 skin testing showed: positive to grass, weed, ragweed, trees, cat and slightly to mold. Interim history - well-controlled with no medications.   Continue environmental control measures.  May use over the counter antihistamines such as Zyrtec (cetirizine), Claritin (loratadine), Allegra (fexofenadine), or Xyzal (levocetirizine) daily as needed.  Return if symptoms worsen or fail to improve.  Diagnostics: Spirometry:  Tracings reviewed. His effort: Good reproducible efforts. FVC: 4.94L FEV1: 3.87L, 96% predicted FEV1/FVC ratio: 78% Interpretation: Spirometry consistent with normal pattern.  Please see scanned spirometry results for details.  Medication List:  Current Outpatient Medications  Medication Sig Dispense Refill  . albuterol (PROVENTIL HFA;VENTOLIN HFA) 108 (90 Base) MCG/ACT inhaler Use 2 puffs every 4 to 6 hours as needed for shortness of breath, chest tightness, coughing and wheezing (Patient not taking: Reported on 07/23/2018) 1  Inhaler 1   No current facility-administered medications for this visit.    Allergies: Allergies  Allergen Reactions  . Pollen Extract Other (See Comments)    Unknown reaction   I reviewed his past medical history, social history,  family history, and environmental history and no significant changes have been reported from previous visit on 06/04/2018.  Review of Systems  Constitutional: Negative for appetite change, chills, fever and unexpected weight change.  HENT: Negative for congestion and rhinorrhea.   Eyes: Negative for itching.  Respiratory: Negative for cough, chest tightness, shortness of breath and wheezing.   Cardiovascular: Negative for chest pain.  Gastrointestinal: Negative for abdominal pain.  Genitourinary: Negative for difficulty urinating.  Skin: Negative for rash.  Allergic/Immunologic: Positive for environmental allergies.  Neurological: Negative for headaches.   Objective: BP 108/70   Pulse 76   Resp 16  There is no height or weight on file to calculate BMI. Physical Exam  Constitutional: He is oriented to person, place, and time. He appears well-developed and well-nourished.  HENT:  Head: Normocephalic and atraumatic.  Right Ear: External ear normal.  Left Ear: External ear normal.  Nose: Nose normal.  Mouth/Throat: Oropharynx is clear and moist.  Eyes: Conjunctivae and EOM are normal.  Neck: Neck supple.  Cardiovascular: Normal rate, regular rhythm and normal heart sounds. Exam reveals no gallop and no friction rub.  No murmur heard. Pulmonary/Chest: Effort normal and breath sounds normal. He has no wheezes. He has no rales.  Abdominal: Soft.  Lymphadenopathy:    He has no cervical adenopathy.  Neurological: He is alert and oriented to person, place, and time.  Skin: Skin is warm. No rash noted.  Psychiatric: He has a normal mood and affect. His behavior is normal.  Nursing note and vitals reviewed.  Previous notes and tests were reviewed. The plan was reviewed with the patient/family, and all questions/concerned were addressed.  It was my pleasure to see George Parks today and participate in his care. Please feel free to contact me with any questions or  concerns.  Sincerely,  Wyline Mood, DO Allergy & Immunology  [facility] office: [phone] [facility] office: [phone]

## 2018-07-23 NOTE — Assessment & Plan Note (Signed)
Past history - 2019 skin testing showed: positive to grass, weed, ragweed, trees, cat and slightly to mold. Interim history - well-controlled with no medications.   Continue environmental control measures.  May use over the counter antihistamines such as Zyrtec (cetirizine), Claritin (loratadine), Allegra (fexofenadine), or Xyzal (levocetirizine) daily as needed.

## 2018-07-23 NOTE — Assessment & Plan Note (Signed)
See plan as above.  Today's spirometry was normal and no inhaler use since the last visit.   May use albuterol rescue inhaler 2 puffs or nebulizer every 4 to 6 hours as needed for shortness of breath, chest tightness, coughing, and wheezing. May use albuterol rescue inhaler 2 puffs 5 to 15 minutes prior to strenuous physical activities.

## 2018-07-24 NOTE — Addendum Note (Signed)
Addended by: Ander Purpura R on: 07/24/2018 09:26 AM   Modules accepted: Orders

## 2018-10-17 ENCOUNTER — Encounter: Payer: Self-pay | Admitting: Gynecology

## 2018-10-17 ENCOUNTER — Ambulatory Visit
Admission: EM | Admit: 2018-10-17 | Discharge: 2018-10-17 | Disposition: A | Discharge status: Home / Self Care | Payer: Managed Care, Other (non HMO) | Attending: Family Medicine | Admitting: Family Medicine

## 2018-10-17 ENCOUNTER — Other Ambulatory Visit: Payer: Self-pay

## 2018-10-17 DIAGNOSIS — R369 Urethral discharge, unspecified: Secondary | ICD-10-CM

## 2018-10-17 LAB — URINALYSIS, COMPLETE (UACMP) WITH MICROSCOPIC
Bacteria, UA: NONE SEEN
Bilirubin Urine: NEGATIVE
Glucose, UA: NEGATIVE mg/dL
Hgb urine dipstick: NEGATIVE
Ketones, ur: NEGATIVE mg/dL
Nitrite: NEGATIVE
Protein, ur: NEGATIVE mg/dL
RBC / HPF: NONE SEEN RBC/hpf (ref 0–5)
Specific Gravity, Urine: 1.025 (ref 1.005–1.030)
pH: 5.5 (ref 5.0–8.0)

## 2018-10-17 LAB — CHLAMYDIA/NGC RT PCR (ARMC ONLY)??????????

## 2018-10-17 LAB — CHLAMYDIA/NGC RT PCR (ARMC ONLY)
Chlamydia Tr: DETECTED — AB
N gonorrhoeae: NOT DETECTED

## 2018-10-17 MED ORDER — AZITHROMYCIN 500 MG PO TABS
1000.0000 mg | ORAL_TABLET | Freq: Once | ORAL | Status: AC
  Administered 2018-10-17: 1000 mg via ORAL

## 2018-10-17 MED ORDER — CEFTRIAXONE SODIUM 250 MG IJ SOLR
250.0000 mg | Freq: Once | INTRAMUSCULAR | Status: AC
  Administered 2018-10-17: 250 mg via INTRAMUSCULAR

## 2018-10-17 NOTE — ED Provider Notes (Signed)
[facility]    CSN: [id number] Arrival date & time: 10/17/18  1202  History   Chief Complaint Penile discharge  HPI  28 year old male presents with penile discharge.  Patient states that he had intended intercourse 2 weeks ago with a new partner.  Patient reports that he has had penile discharge for the past 2 days.  He states that it seems to be white.  No dysuria or urinary frequency/urgency.  No reports of fever.  Reports a rash.  Patient is concerned that he may have an STD.  No other associated symptoms.  No other complaints or concerns at this time.  History reviewed as below.  Patient Active Problem List   Diagnosis Date Noted  . Allergic reaction 06/05/2018  . Wheezing 06/04/2018  . Other allergic rhinitis 06/04/2018   Past Surgical History:  Procedure Laterality Date  . NO PAST SURGERIES       Home Medications    Prior to Admission medications   Medication Sig Start Date End Date Taking? Authorizing Provider  albuterol (PROVENTIL HFA;VENTOLIN HFA) 108 (90 Base) MCG/ACT inhaler Use 2 puffs every 4 to 6 hours as needed for shortness of breath, chest tightness, coughing and wheezing 06/04/18  Yes Ellamae Sia, DO    Family History Family History  Problem Relation Age of Onset  . Diabetes Other     Social History Social History   Tobacco Use  . Smoking status: Never Smoker  . Smokeless tobacco: Never Used  Substance Use Topics  . Alcohol use: Not Currently  . Drug use: Not Currently     Allergies   Pollen extract   Review of Systems Review of Systems  Constitutional: Negative.   Genitourinary: Positive for discharge. Negative for dysuria, frequency and urgency.   Physical Exam Triage Vital Signs ED Triage Vitals  Enc Vitals Group     BP 10/17/18 1211 129/72     Pulse Rate 10/17/18 1211 77     Resp 10/17/18 1211 16     Temp 10/17/18 1211 98.6 F (37 C)     Temp Source 10/17/18 1211 Oral     SpO2 10/17/18 1211 98 %     Weight  10/17/18 1210 220 lb (99.8 kg)     Height 10/17/18 1210 5\' 11"  (1.803 m)     Head Circumference --      Peak Flow --      Pain Score 10/17/18 1210 0     Pain Loc --      Pain Edu? --      Excl. in GC? --    Updated Vital Signs BP 129/72 (BP Location: Left Arm)   Pulse 77   Temp 98.6 F (37 C) (Oral)   Resp 16   Ht 5\' 11"  (1.803 m)   Wt 99.8 kg   SpO2 98%   BMI 30.68 kg/m   Visual Acuity Right Eye Distance:   Left Eye Distance:   Bilateral Distance:    Right Eye Near:   Left Eye Near:    Bilateral Near:     Physical Exam Vitals signs and nursing note reviewed.  Constitutional:      General: He is not in acute distress.    Appearance: Normal appearance.  HENT:     Head: Normocephalic and atraumatic.  Eyes:     General:        Right eye: No discharge.        Left eye: No discharge.  Conjunctiva/sclera: Conjunctivae normal.  Pulmonary:     Effort: Pulmonary effort is normal. No respiratory distress.  Genitourinary:    Comments: Penile discharge noted.  No rash noted.  No discrete areas of tenderness. Neurological:     Mental Status: He is alert.  Psychiatric:        Mood and Affect: Mood normal.        Behavior: Behavior normal.    UC Treatments / Results  Labs (all labs ordered are listed, but only abnormal results are displayed) Labs Reviewed  URINALYSIS, COMPLETE (UACMP) WITH MICROSCOPIC - Abnormal; Notable for the following components:      Result Value   Leukocytes,Ua TRACE (*)    All other components within normal limits  CHLAMYDIA/NGC RT PCR ([facility] ONLY)  URINE CULTURE    EKG None  Radiology No results found.  Procedures Procedures (including critical care time)  Medications Ordered in UC Medications  cefTRIAXone (ROCEPHIN) injection 250 mg (has no administration in time range)  azithromycin (ZITHROMAX) tablet 1,000 mg (1,000 mg Oral Given 10/17/18 1250)    Initial Impression / Assessment and Plan / UC Course  I have  reviewed the triage vital signs and the nursing notes.  Pertinent labs & imaging results that were available during my care of the patient were reviewed by me and considered in my medical decision making (see chart for details).    28 year old male presents with penile discharge.  We discussed empiric treatment versus awaiting results.  Patient elected for empiric treatment for gonorrhea chlamydia today.  Rocephin and azithromycin given.  Final Clinical Impressions(s) / UC Diagnoses   Final diagnoses:  Penile discharge     Discharge Instructions     We will call with the results of the testing.  This should take care of your issue.  Take care  Dr. Adriana Simas     ED Prescriptions    None     Controlled Substance Prescriptions [place] Controlled Substance Registry consulted? Not Applicable   Tommie Sams, DO 10/17/18 1252

## 2018-10-17 NOTE — ED Triage Notes (Signed)
Patient c/o penis drainage. Per patient not sure std or urinary problem x 2 days.

## 2018-10-17 NOTE — Discharge Instructions (Signed)
We will call with the results of the testing.  This should take care of your issue.  Take care  Dr. Adriana Simas

## 2018-10-19 ENCOUNTER — Telehealth (HOSPITAL_COMMUNITY): Payer: Self-pay | Admitting: Emergency Medicine

## 2018-10-19 LAB — URINE CULTURE: Culture: NO GROWTH

## 2018-10-19 NOTE — Telephone Encounter (Signed)
Chlamydia is positive.  This was treated at the urgent care visit with po zithromax 1g.  Pt needs education to please refrain from sexual intercourse for 7 days to give the medicine time to work.  Sexual partners need to be notified and tested/treated.  Condoms may reduce risk of reinfection.  Recheck or followup with PCP for further evaluation if symptoms are not improving.  [facility] notified.  Attempted to reach patient. No answer at this time. Voicemail left.    

## 2018-10-19 NOTE — Telephone Encounter (Signed)
Patient contacted and made aware of all results, all questions answered.   

## 2018-11-09 ENCOUNTER — Other Ambulatory Visit: Payer: Self-pay

## 2018-11-09 ENCOUNTER — Ambulatory Visit
Admission: RE | Admit: 2018-11-09 | Discharge: 2018-11-09 | Disposition: A | Discharge status: Home / Self Care | Payer: PRIVATE HEALTH INSURANCE | Source: Ambulatory Visit | Attending: Family Medicine | Admitting: Family Medicine

## 2018-11-09 ENCOUNTER — Ambulatory Visit
Admission: RE | Admit: 2018-11-09 | Discharge: 2018-11-09 | Disposition: A | Discharge status: Home / Self Care | Payer: PRIVATE HEALTH INSURANCE | Attending: Family Medicine | Admitting: Family Medicine

## 2018-11-09 ENCOUNTER — Other Ambulatory Visit: Payer: Self-pay | Admitting: Family Medicine

## 2018-11-09 DIAGNOSIS — R52 Pain, unspecified: Secondary | ICD-10-CM

## 2018-11-09 DIAGNOSIS — S99921A Unspecified injury of right foot, initial encounter: Secondary | ICD-10-CM | POA: Insufficient documentation

## 2020-05-16 IMAGING — CR RIGHT ANKLE - COMPLETE 3+ VIEW
1 series · 3 of 3 positions shown · non-contrast
Comparison: None.

CLINICAL DATA: Right ankle pain after injury at work.

EXAM:
RIGHT ANKLE - COMPLETE 3+ VIEW

[Series 1: dg ankle complete right · 0.14mm/px · 3 of 3 slices shown]
[im 1/3]
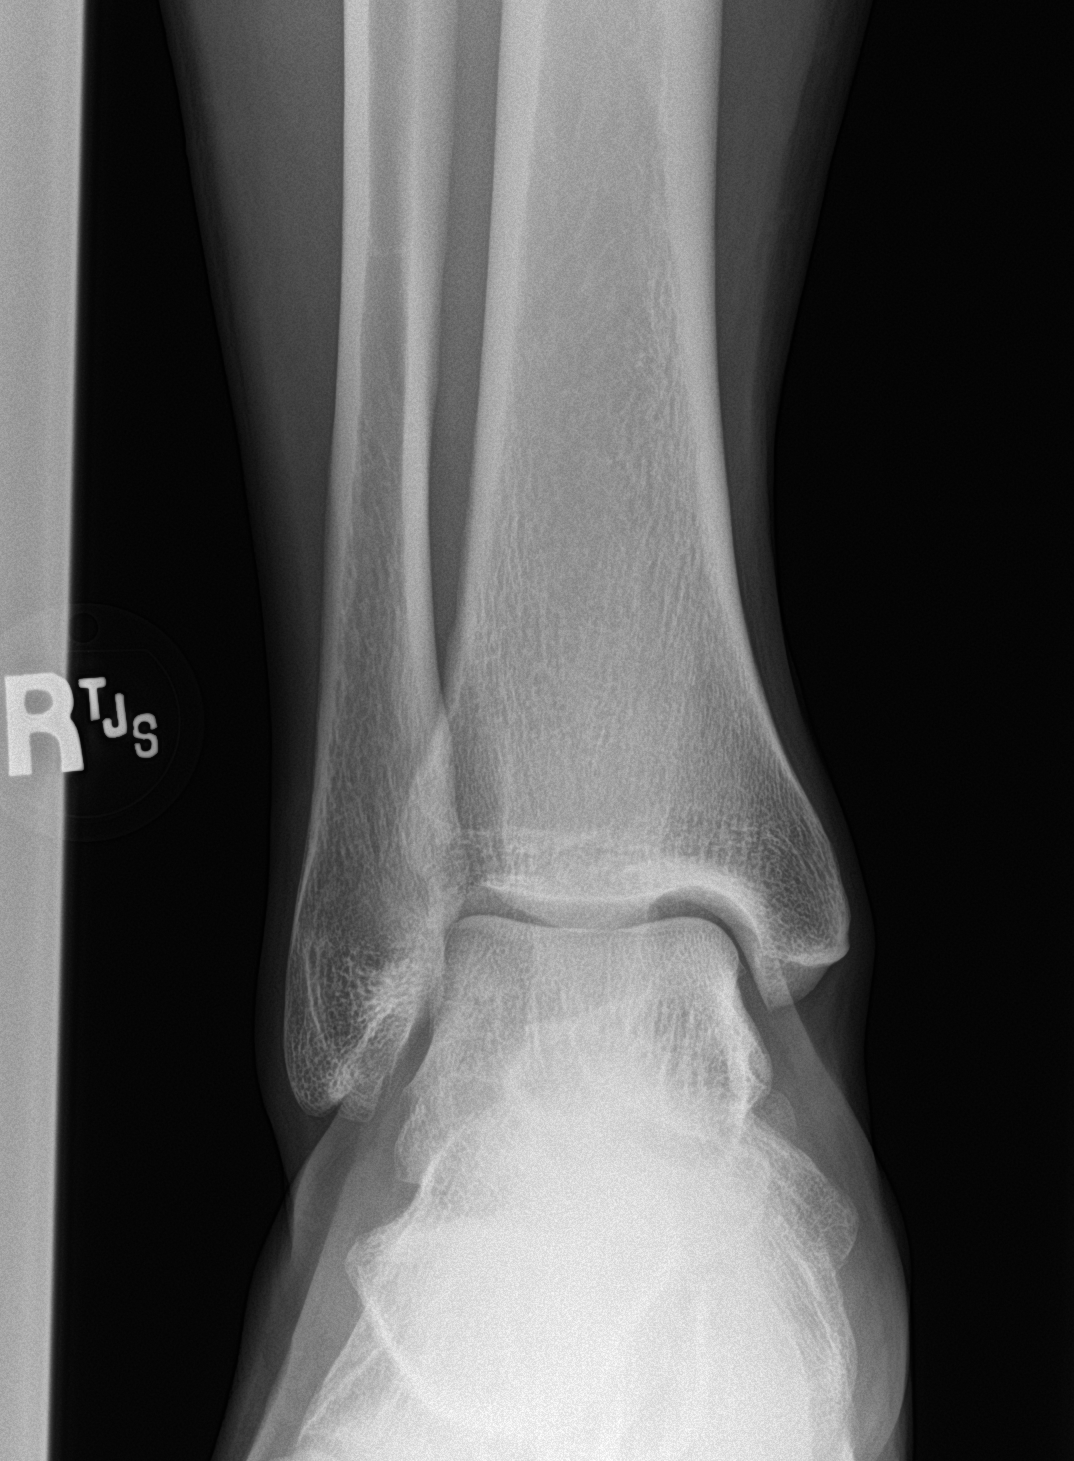
[im 2/3]
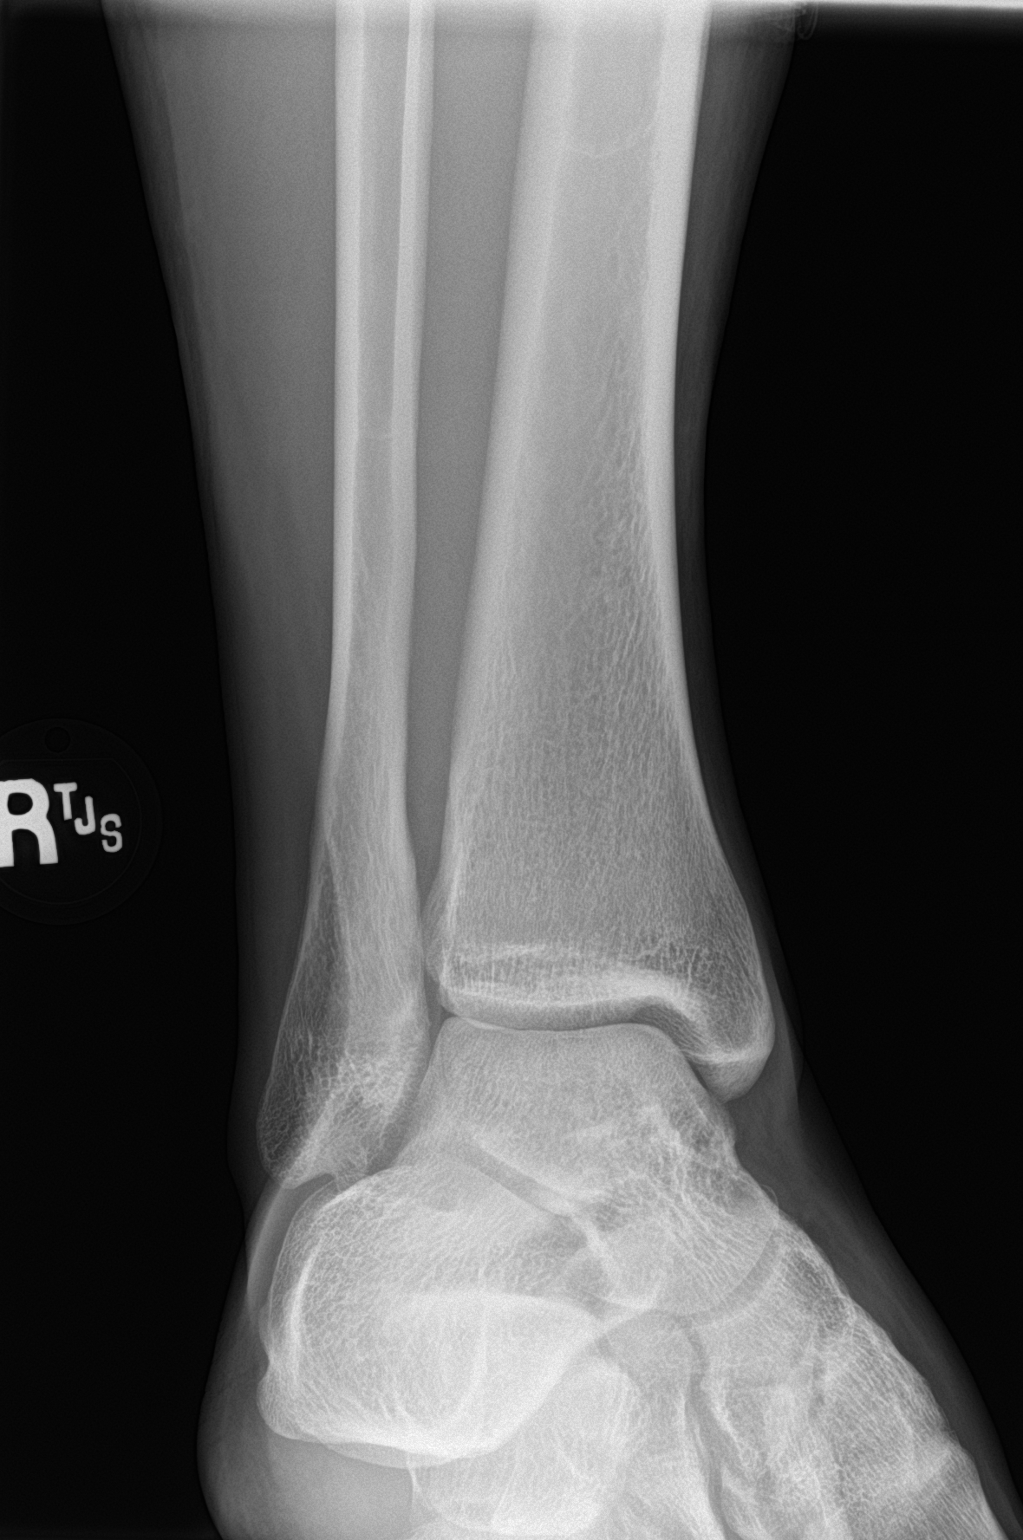
[im 3/3]
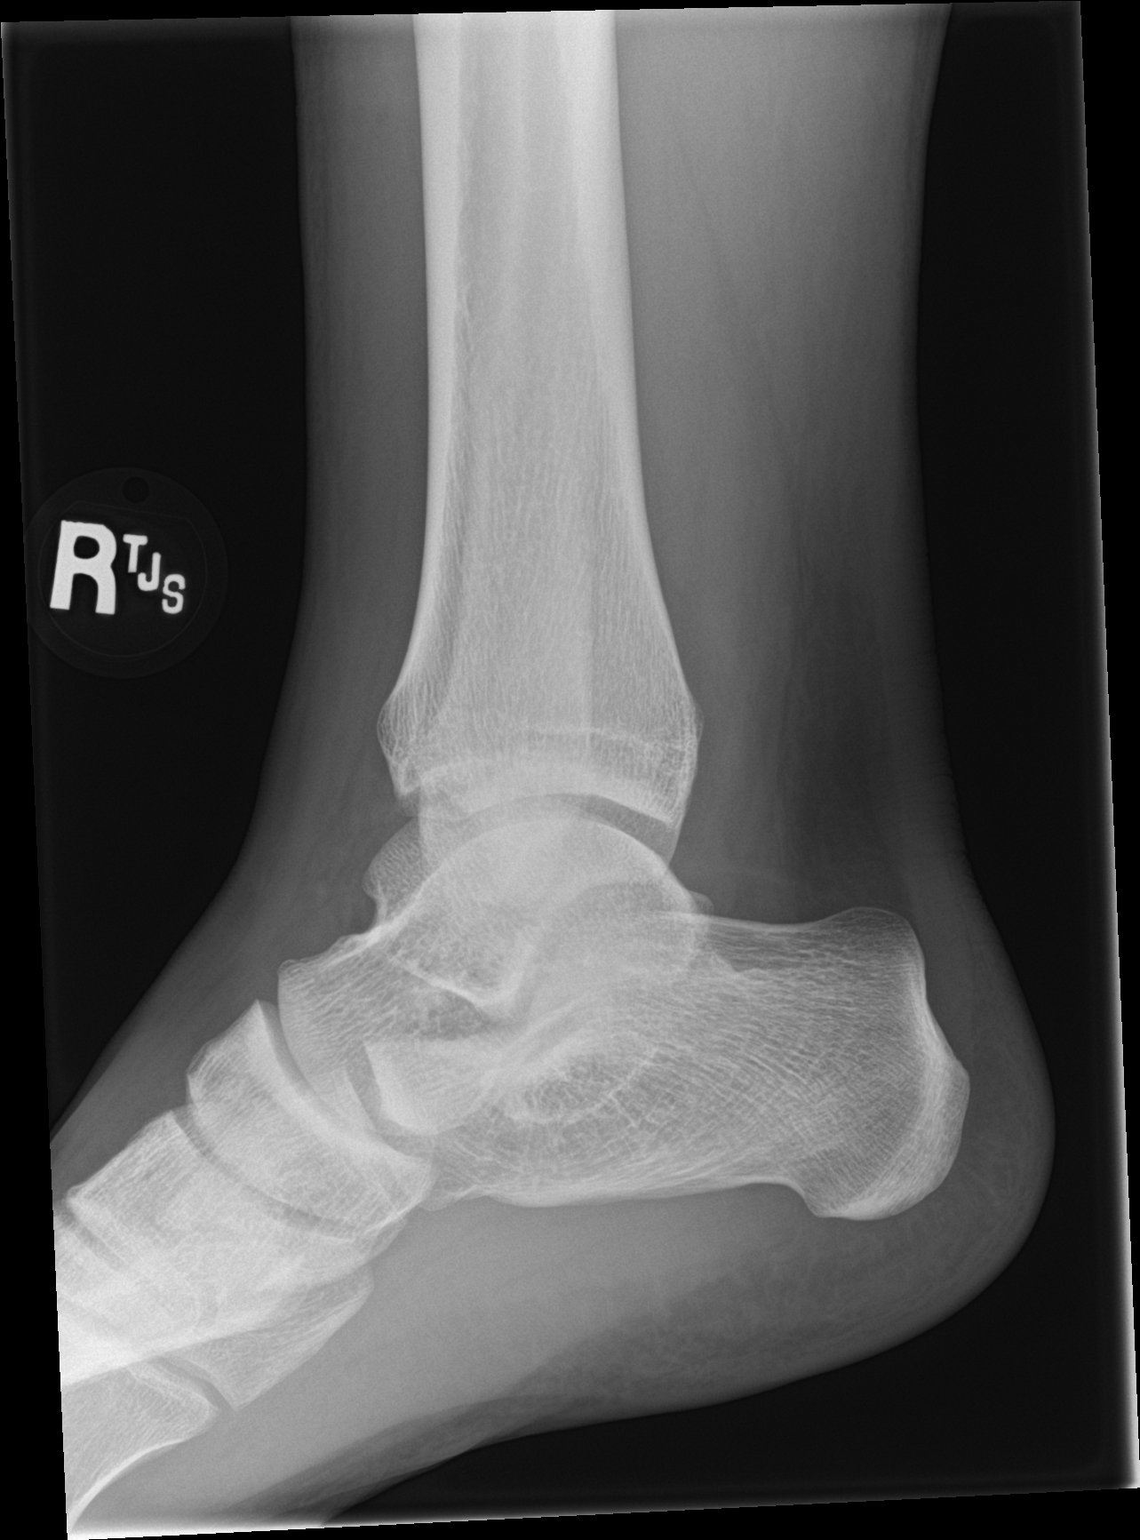

[3 of 3 positions shown; findings below may reference images not displayed]

FINDINGS: There is no evidence of fracture, dislocation, or joint effusion.
There is no evidence of arthropathy or other focal bone abnormality.
Soft tissues are unremarkable.
IMPRESSION: Negative.
